# Patient Record
Sex: Female | Born: 2000 | Race: White | Hispanic: No | Marital: Single | Smoking: Never smoker
Health system: Southern US, Community
[De-identification: ages and names within clinical notes are randomized; demographics above are authoritative.]

## PROBLEM LIST (undated history)

## (undated) ENCOUNTER — Inpatient Hospital Stay: Payer: Self-pay

## (undated) DIAGNOSIS — O139 Gestational [pregnancy-induced] hypertension without significant proteinuria, unspecified trimester: Secondary | ICD-10-CM

## (undated) DIAGNOSIS — F909 Attention-deficit hyperactivity disorder, unspecified type: Secondary | ICD-10-CM

## (undated) HISTORY — PX: NO PAST SURGERIES: SHX2092

---
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
## 2021-10-27 ENCOUNTER — Ambulatory Visit: Payer: Medicaid Other

## 2023-01-12 ENCOUNTER — Encounter: Payer: Self-pay | Admitting: *Deleted

## 2023-01-12 ENCOUNTER — Other Ambulatory Visit: Payer: Self-pay

## 2023-01-12 ENCOUNTER — Emergency Department
Admission: EM | Admit: 2023-01-12 | Discharge: 2023-01-12 | Disposition: A | Discharge status: Home / Self Care | Payer: Medicaid Other | Attending: Emergency Medicine | Admitting: Emergency Medicine

## 2023-01-12 DIAGNOSIS — L292 Pruritus vulvae: Secondary | ICD-10-CM | POA: Diagnosis present

## 2023-01-12 DIAGNOSIS — N72 Inflammatory disease of cervix uteri: Secondary | ICD-10-CM | POA: Insufficient documentation

## 2023-01-12 LAB — URINALYSIS, ROUTINE W REFLEX MICROSCOPIC
Bilirubin Urine: NEGATIVE
Glucose, UA: NEGATIVE mg/dL
Ketones, ur: NEGATIVE mg/dL
Nitrite: NEGATIVE
Protein, ur: NEGATIVE mg/dL
RBC / HPF: >50 RBC/hpf (ref 0–5)
Specific Gravity, Urine: 1.006 (ref 1.005–1.030)
pH: 6 (ref 5.0–8.0)

## 2023-01-12 LAB — WET PREP, GENITAL
Clue Cells Wet Prep HPF POC: NONE SEEN
Sperm: NONE SEEN
Trich, Wet Prep: NONE SEEN
WBC, Wet Prep HPF POC: >=10 — AB (ref <10)
Yeast Wet Prep HPF POC: NONE SEEN

## 2023-01-12 MED ORDER — AZITHROMYCIN 500 MG PO TABS
1000.0000 mg | ORAL_TABLET | Freq: Once | ORAL | Status: AC
  Administered 2023-01-12: 1000 mg via ORAL
  Filled 2023-01-12: qty 2

## 2023-01-12 MED ORDER — LIDOCAINE HCL URETHRAL/MUCOSAL 2 % EX GEL
1.0000 | Freq: Once | CUTANEOUS | Status: AC
  Administered 2023-01-12: 1 via TOPICAL
  Filled 2023-01-12: qty 6

## 2023-01-12 MED ORDER — CEFTRIAXONE SODIUM 1 G IJ SOLR
500.0000 mg | Freq: Once | INTRAMUSCULAR | Status: AC
  Administered 2023-01-12: 500 mg via INTRAMUSCULAR
  Filled 2023-01-12: qty 10

## 2023-01-12 MED ORDER — LIDOCAINE 5 % EX OINT: 1.0000 | TOPICAL_OINTMENT | CUTANEOUS | 0 refills | Status: AC | PRN

## 2023-01-12 MED ORDER — FLUCONAZOLE 50 MG PO TABS
150.0000 mg | ORAL_TABLET | Freq: Once | ORAL | Status: AC
  Administered 2023-01-12: 150 mg via ORAL
  Filled 2023-01-12: qty 1

## 2023-01-12 NOTE — Discharge Instructions (Addendum)
No intercourse for the next week.  Follow-up with health department if not improving over the next few days.

## 2023-01-12 NOTE — ED Notes (Signed)
Poct pregnancy Negative 

## 2023-01-12 NOTE — ED Triage Notes (Signed)
Pt reports vaginal irritation and itching   pt using otc meds without relief.  Pt denies urinary sx  no vag bleeding. Pt alert

## 2023-01-12 NOTE — ED Provider Notes (Signed)
[facility] Provider Note    Event Date/Time   First MD Initiated Contact with Patient 01/12/23 2126     (approximate)   History   Vaginal Itching   HPI  Stefanie Owen is a 22 y.o. female  and as listed in EMR presents to the emergency department for evaluation of vaginal itching. No history of yeast infections.  No concern for STI.  She did go to the beach last week and was in a hot tub.  She denies abdominal pain, pelvic pain, or fever..      Physical Exam   Triage Vital Signs: ED Triage Vitals [01/12/23 2046]  Enc Vitals Group     BP 127/85     Pulse Rate (!) 115     Resp 18     Temp 98.5 F (36.9 C)     Temp Source Oral     SpO2 99 %     Weight 200 lb (90.7 kg)     Height  (1.6 m)     Head Circumference      Peak Flow      Pain Score 5     Pain Loc      Pain Edu?      Excl. in GC?     Most recent vital signs: Vitals:   01/12/23 2046  BP: 127/85  Pulse: (!) 115  Resp: 18  Temp: 98.5 F (36.9 C)  SpO2: 99%    General: Awake, no distress.  CV:  Good peripheral perfusion.  Resp:  Normal effort.  Abd:  No distention.  Other:  Vaginal exam deferred per patient request.   ED Results / Procedures / Treatments   Labs (all labs ordered are listed, but only abnormal results are displayed) Labs Reviewed  WET PREP, GENITAL - Abnormal; Notable for the following components:      Result Value   WBC, Wet Prep HPF POC >=10 (*)    All other components within normal limits  URINALYSIS, ROUTINE W REFLEX MICROSCOPIC - Abnormal; Notable for the following components:   Color, Urine YELLOW (*)    APPearance HAZY (*)    Hgb urine dipstick LARGE (*)    Leukocytes,Ua LARGE (*)    Bacteria, UA RARE (*)    All other components within normal limits     EKG  Not indicated   RADIOLOGY  Image and radiology report reviewed and interpreted by me. Radiology report consistent with the same.  Not  indicated  PROCEDURES:  Critical Care performed: No  Procedures   MEDICATIONS ORDERED IN ED:  Medications  cefTRIAXone (ROCEPHIN) injection 500 mg (500 mg Intramuscular Given 01/12/23 2331)  azithromycin (ZITHROMAX) tablet 1,000 mg (1,000 mg Oral Given 01/12/23 2330)  lidocaine (XYLOCAINE) 2 % jelly 1 Application (1 Application Topical Given 01/12/23 2331)  fluconazole (DIFLUCAN) tablet 150 mg (150 mg Oral Given 01/12/23 2330)     IMPRESSION / MDM / ASSESSMENT AND PLAN / ED COURSE   I have reviewed the triage note.  Differential diagnosis includes, but is not limited to, bacterial vaginosis, yeast vaginitis  Patient's presentation is most consistent with acute illness / injury with system symptoms.  22 year old female presenting to the emergency department for treatment and evaluation of vaginal itching and reports a white coating on the vulva.  Patient declines pelvic exam, chlamydia and gonorrhea testing.  Self swab for trichomonas, bacterial vaginosis, and yeast explained to the patient and she obtained this specimen.  Wet prep shows greater  than 10 white blood cells and urine shows large amount leukocytes and rare bacteria.  She has no urinary symptoms.  I suspect STI of a different nature other than trichomonas or bacterial vaginosis.  She will be treated with Rocephin and azithromycin.  It does look like she has some component of candidal vaginal infection and will be given a dose of Diflucan while here.  She also requested some numbing gel for the area and lidocaine was ordered.  Patient instructed to follow-up with the health department for further STI testing if not improving over the next week.       FINAL CLINICAL IMPRESSION(S) / ED DIAGNOSES   Final diagnoses:  Acute cervicitis     Rx / DC Orders   ED Discharge Orders          Ordered    lidocaine (XYLOCAINE) 5 % ointment  As needed        01/12/23 2318             Note:  This document was prepared  using Dragon voice recognition software and may include unintentional dictation errors.   Chinita Pester, FNP 01/12/23 2336    Georga Hacking, MD 01/13/23 [phone]

## 2023-10-02 ENCOUNTER — Emergency Department
Admission: EM | Admit: 2023-10-02 | Discharge: 2023-10-02 | Disposition: A | Discharge status: Home / Self Care | Payer: Medicaid Other | Attending: Emergency Medicine | Admitting: Emergency Medicine

## 2023-10-02 ENCOUNTER — Other Ambulatory Visit: Payer: Self-pay

## 2023-10-02 DIAGNOSIS — E860 Dehydration: Secondary | ICD-10-CM | POA: Insufficient documentation

## 2023-10-02 DIAGNOSIS — R5383 Other fatigue: Secondary | ICD-10-CM | POA: Diagnosis not present

## 2023-10-02 DIAGNOSIS — R55 Syncope and collapse: Secondary | ICD-10-CM | POA: Insufficient documentation

## 2023-10-02 DIAGNOSIS — D649 Anemia, unspecified: Secondary | ICD-10-CM | POA: Diagnosis not present

## 2023-10-02 LAB — CBC
HCT: 37.2 % (ref 36.0–46.0)
Hemoglobin: 11.1 g/dL — ABNORMAL LOW (ref 12.0–15.0)
MCH: 22.7 pg — ABNORMAL LOW (ref 26.0–34.0)
MCHC: 29.8 g/dL — ABNORMAL LOW (ref 30.0–36.0)
MCV: 75.9 fL — ABNORMAL LOW (ref 80.0–100.0)
Platelets: 397 10*3/uL (ref 150–400)
RBC: 4.9 MIL/uL (ref 3.87–5.11)
RDW: 15.2 % (ref 11.5–15.5)
WBC: 8.7 10*3/uL (ref 4.0–10.5)
nRBC: 0 % (ref 0.0–0.2)

## 2023-10-02 LAB — URINALYSIS, ROUTINE W REFLEX MICROSCOPIC
Bilirubin Urine: NEGATIVE
Glucose, UA: NEGATIVE mg/dL
Ketones, ur: NEGATIVE mg/dL
Nitrite: NEGATIVE
Protein, ur: NEGATIVE mg/dL
Specific Gravity, Urine: 1.014 (ref 1.005–1.030)
pH: 5 (ref 5.0–8.0)

## 2023-10-02 LAB — BASIC METABOLIC PANEL
Anion gap: 9 (ref 5–15)
BUN: 14 mg/dL (ref 6–20)
CO2: 23 mmol/L (ref 22–32)
Calcium: 8.5 mg/dL — ABNORMAL LOW (ref 8.9–10.3)
Chloride: 105 mmol/L (ref 98–111)
Creatinine, Ser: 0.83 mg/dL (ref 0.44–1.00)
GFR, Estimated: >60 mL/min (ref >60)
Glucose, Bld: 117 mg/dL — ABNORMAL HIGH (ref 70–99)
Potassium: 3.6 mmol/L (ref 3.5–5.1)
Sodium: 137 mmol/L (ref 135–145)

## 2023-10-02 LAB — POC URINE PREG, ED: Preg Test, Ur: NEGATIVE

## 2023-10-02 MED ORDER — LACTATED RINGERS IV BOLUS
1000.0000 mL | Freq: Once | INTRAVENOUS | Status: AC
  Administered 2023-10-02: 1000 mL via INTRAVENOUS

## 2023-10-02 NOTE — ED Provider Notes (Signed)
 [facility] Provider Note    Event Date/Time   First MD Initiated Contact with Patient 10/02/23 [phone]     (approximate)   History   Near Syncope   HPI Siddhi Dornbush is a 23 y.o. female presenting today for lightheadedness.  Patient states feeling fine this morning when she woke up and she was at [employer] when she started to feel lightheaded.  She thinks she may have passed out but is not sure.  She notes that she has been dehydrated and not drinking a lot of water recently.  Otherwise currently and around the time of event denied chest pain, difficulty breathing, nausea, vomiting, abdominal pain.  No recent dysuria, fevers, congestion, cough.  Denies prior episodes similar to this.  Chart review: Patient had similar syncopal episode in 2022 that was largely found to be related to dehydration.     Physical Exam   Triage Vital Signs: ED Triage Vitals  Encounter Vitals Group     BP 10/02/23 0739 137/89     Systolic BP Percentile --      Diastolic BP Percentile --      Pulse Rate 10/02/23 0739 98     Resp 10/02/23 0739 18     Temp 10/02/23 0739 98 F (36.7 C)     Temp src --      SpO2 10/02/23 0739 100 %     Weight 10/02/23 0736 200 lb (90.7 kg)     Height 10/02/23 0736 5' 3 (1.6 m)     Head Circumference --      Peak Flow --      Pain Score 10/02/23 0736 0     Pain Loc --      Pain Education --      Exclude from Growth Chart --     Most recent vital signs: Vitals:   10/02/23 0739  BP: 137/89  Pulse: 98  Resp: 18  Temp: 98 F (36.7 C)  SpO2: 100%   I have reviewed the vital signs. General:  Awake, alert, no acute distress. Head:  Normocephalic, Atraumatic. EENT:  PERRL, EOMI, Oral mucosa pink and moist, Neck is supple. Cardiovascular: Regular rate, 2+ distal pulses. Respiratory:  Normal respiratory effort, symmetrical expansion, no distress.   Extremities:  Moving all four extremities through full ROM without pain.   Neuro:  Alert  and oriented.  Interacting appropriately.   Skin:  Warm, dry, no rash.   Psych: Appropriate affect.    ED Results / Procedures / Treatments   Labs (all labs ordered are listed, but only abnormal results are displayed) Labs Reviewed  BASIC METABOLIC PANEL - Abnormal; Notable for the following components:      Result Value   Glucose, Bld 117 (*)    Calcium 8.5 (*)    All other components within normal limits  CBC - Abnormal; Notable for the following components:   Hemoglobin 11.1 (*)    MCV 75.9 (*)    MCH 22.7 (*)    MCHC 29.8 (*)    All other components within normal limits  URINALYSIS, ROUTINE W REFLEX MICROSCOPIC - Abnormal; Notable for the following components:   Color, Urine YELLOW (*)    APPearance CLEAR (*)    Hgb urine dipstick SMALL (*)    Leukocytes,Ua SMALL (*)    Bacteria, UA RARE (*)    All other components within normal limits  CBG MONITORING, ED  POC URINE PREG, ED     EKG My EKG  interpretation: Rate of 107, sinus tachycardia, normal axis, normal intervals, no acute ST elevations or depressions   RADIOLOGY    PROCEDURES:  Critical Care performed: No  Procedures   MEDICATIONS ORDERED IN ED: Medications  lactated ringers  bolus 1,000 mL (has no administration in time range)     IMPRESSION / MDM / ASSESSMENT AND PLAN / ED COURSE  I reviewed the triage vital signs and the nursing notes.                              Differential diagnosis includes, but is not limited to, dehydration, near syncope, orthostatic hypotension, lower suspicion for cardiac etiology  Patient's presentation is most consistent with acute complicated illness / injury requiring diagnostic workup.  Patient is a 23 year old female presenting today for lightheadedness and near loss of consciousness.  No associated symptoms around the time of event and currently asymptomatic other than feeling fatigued.  Does specifically note dehydration recently and poor fluid intake.   Slightly tachycardic on arrival but otherwise vital signs stable.  Ambulating without difficulty.  Able to tolerate p.o. without difficulty.  Laboratory workup at this time largely reassuring with normal CBC and BMP with anemia consistent with her baseline if not improved.  UA has rare bacteria and small leukocytes but also squames present.  She has no dysuria symptoms so will not treat as a UTI at this time.  Pregnancy test negative.  EKG without other concerning arrhythmia findings.  Patient was given 1 L of fluids and safe for discharge at this time.  Will give her PCP referral for outpatient follow-up.  Clinical Course as of 10/02/23 9140  [place] Oct 02, 2023  0846 Hemoglobin(!): 11.1 Anemia improved from prior levels [DW]    Clinical Course User Index [DW] Malvina Alm DASEN, MD     FINAL CLINICAL IMPRESSION(S) / ED DIAGNOSES   Final diagnoses:  Near syncope     Rx / DC Orders   ED Discharge Orders          Ordered    Ambulatory Referral to Primary Care (Establish Care)        10/02/23 0859             Note:  This document was prepared using Dragon voice recognition software and may include unintentional dictation errors.   Malvina Alm DASEN, MD 10/02/23 [phone]
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
## 2023-10-02 NOTE — Discharge Instructions (Addendum)
 You can use the resource guide provided to help set up a primary care appointment in the future for yearly monitoring.  Please return for any worsening symptoms.

## 2024-01-07 ENCOUNTER — Other Ambulatory Visit: Payer: Self-pay

## 2024-01-07 ENCOUNTER — Emergency Department
Admission: EM | Admit: 2024-01-07 | Discharge: 2024-01-07 | Disposition: A | Discharge status: Home / Self Care | Attending: Emergency Medicine | Admitting: Emergency Medicine

## 2024-01-07 ENCOUNTER — Emergency Department

## 2024-01-07 DIAGNOSIS — O4690 Antepartum hemorrhage, unspecified, unspecified trimester: Secondary | ICD-10-CM | POA: Diagnosis present

## 2024-01-07 DIAGNOSIS — O036 Delayed or excessive hemorrhage following complete or unspecified spontaneous abortion: Secondary | ICD-10-CM | POA: Insufficient documentation

## 2024-01-07 LAB — CBC WITH DIFFERENTIAL/PLATELET
Abs Immature Granulocytes: 0.03 10*3/uL (ref 0.00–0.07)
Basophils Absolute: 0.1 10*3/uL (ref 0.0–0.1)
Basophils Relative: 1 %
Eosinophils Absolute: 0.1 10*3/uL (ref 0.0–0.5)
Eosinophils Relative: 1 %
HCT: 37.1 % (ref 36.0–46.0)
Hemoglobin: 11.5 g/dL — ABNORMAL LOW (ref 12.0–15.0)
Immature Granulocytes: 0 %
Lymphocytes Relative: 25 %
Lymphs Abs: 2.6 10*3/uL (ref 0.7–4.0)
MCH: 23.4 pg — ABNORMAL LOW (ref 26.0–34.0)
MCHC: 31 g/dL (ref 30.0–36.0)
MCV: 75.6 fL — ABNORMAL LOW (ref 80.0–100.0)
Monocytes Absolute: 0.7 10*3/uL (ref 0.1–1.0)
Monocytes Relative: 7 %
Neutro Abs: 6.7 10*3/uL (ref 1.7–7.7)
Neutrophils Relative %: 66 %
Platelets: 421 10*3/uL — ABNORMAL HIGH (ref 150–400)
RBC: 4.91 MIL/uL (ref 3.87–5.11)
RDW: 15.8 % — ABNORMAL HIGH (ref 11.5–15.5)
WBC: 10.2 10*3/uL (ref 4.0–10.5)
nRBC: 0 % (ref 0.0–0.2)

## 2024-01-07 LAB — COMPREHENSIVE METABOLIC PANEL WITH GFR
ALT: 12 U/L (ref 0–44)
AST: 22 U/L (ref 15–41)
Albumin: 3.7 g/dL (ref 3.5–5.0)
Alkaline Phosphatase: 55 U/L (ref 38–126)
Anion gap: 7 (ref 5–15)
BUN: 12 mg/dL (ref 6–20)
CO2: 23 mmol/L (ref 22–32)
Calcium: 9.1 mg/dL (ref 8.9–10.3)
Chloride: 105 mmol/L (ref 98–111)
Creatinine, Ser: 0.75 mg/dL (ref 0.44–1.00)
GFR, Estimated: >60 mL/min (ref >60)
Glucose, Bld: 95 mg/dL (ref 70–99)
Potassium: 3.4 mmol/L — ABNORMAL LOW (ref 3.5–5.1)
Sodium: 135 mmol/L (ref 135–145)
Total Bilirubin: 1.1 mg/dL (ref 0.0–1.2)
Total Protein: 7.2 g/dL (ref 6.5–8.1)

## 2024-01-07 LAB — TYPE AND SCREEN
ABO/RH(D): A POS
Antibody Screen: NEGATIVE

## 2024-01-07 LAB — HCG, QUANTITATIVE, PREGNANCY: hCG, Beta Chain, Quant, S: 101992 m[IU]/mL — ABNORMAL HIGH (ref <5)

## 2024-01-07 NOTE — Discharge Instructions (Signed)
 You were seen in the ER for your vaginal bleeding.  Please follow-up with OB/GYN for further evaluation.  If you have significant worsening abdominal pain, heavy vaginal bleeding including saturating a pad an under an hour 2 hours in a row, or any other new or concerning symptoms, please return to the ER for further evaluation.

## 2024-01-07 NOTE — ED Triage Notes (Addendum)
 Pt to Ed via [facility] from [facility] for vaginal bleeding and cramping. Pt took abortion pills from the [facility] in [place] about 2000 yesterday. Pt is passing clots and having abdominal cramping and is concerned about the amount of clots. Pt stated she is [redacted] weeks pregnant. G3P1  Vital signs were all within normal limits for EMS and pt is ambulatory.

## 2024-01-07 NOTE — ED Provider Notes (Signed)
 [facility] Provider Note    Event Date/Time   First MD Initiated Contact with Patient 01/07/24 0001     (approximate)   History   Vaginal Bleeding   HPI  Stefanie Owen is a 23 year old female G3, P1 presenting with vaginal bleeding.  [facility] in [place].  Reports she was given a pill, unsure the name of those and discharged with 4 pills of misoprostol to take today.  She used these around 8 PM and shortly after had onset of abdominal cramping with moderate to large amounts of vaginal bleeding with passage of clots.  Did also have some lightheadedness leading her to present to the ER.  No syncope.     Physical Exam   Triage Vital Signs: ED Triage Vitals  Encounter Vitals Group     BP 01/07/24 0010 120/62     Systolic BP Percentile --      Diastolic BP Percentile --      Pulse Rate 01/07/24 0010 89     Resp 01/07/24 0010 18     Temp 01/07/24 0010 98.2 F (36.8 C)     Temp Source 01/07/24 0010 Oral     SpO2 01/07/24 0010 100 %     Weight 01/07/24 0013 220 lb (99.8 kg)     Height 01/07/24 0013 5\' 4"  (1.626 m)     Head Circumference --      Peak Flow --      Pain Score 01/07/24 0011 8     Pain Loc --      Pain Education --      Exclude from Growth Chart --     Most recent vital signs: Vitals:   01/07/24 0010  BP: 120/62  Pulse: 89  Resp: 18  Temp: 98.2 F (36.8 C)  SpO2: 100%     General: Awake, interactive  CV:  Regular rate, good peripheral perfusion.  Resp:  Unlabored respirations, lungs good auscultation Abd:  Nondistended, soft, no significant tenderness to palpation GU:  Moderate amount of blood noted within the vaginal vault with small clots, able to be cleared, cervix visualized without visible protruding material Neuro:  Symmetric facial movement, fluid speech   ED Results / Procedures / Treatments   Labs (all labs ordered are listed, but only abnormal results are  displayed) Labs Reviewed  CBC WITH DIFFERENTIAL/PLATELET - Abnormal; Notable for the following components:      Result Value   Hemoglobin 11.5 (*)    MCV 75.6 (*)    MCH 23.4 (*)    RDW 15.8 (*)    Platelets 421 (*)    All other components within normal limits  COMPREHENSIVE METABOLIC PANEL WITH GFR - Abnormal; Notable for the following components:   Potassium 3.4 (*)    All other components within normal limits  HCG, QUANTITATIVE, PREGNANCY - Abnormal; Notable for the following components:   hCG, Beta Chain, Quant, S 101,992 (*)    All other components within normal limits  TYPE AND SCREEN     EKG EKG independently reviewed interpreted by myself (ER attending) demonstrates:    RADIOLOGY Imaging independently reviewed and interpreted by myself demonstrates:   Ultrasound demonstrates likely retained products  US  OB Comp < 14 Wks Result Date: 01/07/2024 CLINICAL DATA:  ongoing abortion, eval retained products. Abortion pill take kidney yesterday now passing clots. EXAM: OBSTETRIC <14 WK ULTRASOUND TECHNIQUE: Transabdominal ultrasound was performed for evaluation of the gestation as well as the  maternal uterus and adnexal regions. COMPARISON:  None Available. FINDINGS: Intrauterine gestational sac: None Yolk sac:  Not Visualized. Embryo:  Not Visualized. Cardiac Activity: Not Visualized. Maternal uterus/adnexae: Bilateral ovaries unremarkable. Thickened vascular heterogeneous endometrium-exact measurements difficult on transabdominal only ultrasound. IMPRESSION: Thickened heterogeneous and vascular endometrium with limited evaluation on this transabdominal only ultrasound. Findings suggestive of retained products of conception in the setting of an ongoing abortion. Correlate with beta HCG. Recommend trending of beta hCG to 0. Electronically Signed   By: Morgane  Naveau M.D.   On: 01/07/2024 01:26    PROCEDURES:  Critical Care performed: No  Procedures   MEDICATIONS ORDERED IN  ED: Medications - No data to display   IMPRESSION / MDM / ASSESSMENT AND PLAN / ED COURSE  I reviewed the triage vital signs and the nursing notes.  Differential diagnosis includes, but is not limited to, vaginal bleeding in the setting of ongoing abortion, consideration for acute blood loss anemia, electrolyte abnormality  Patient's presentation is most consistent with acute presentation with potential threat to life or bodily function.  23 year old female presenting for evaluation of vaginal bleeding.  Stable vitals on presentation.  Labs with stable anemia from prior.  hCG remains significantly elevated but of 100,000.  Ultrasound with likely retained products.  On exam here, patient with moderate blood in her vaginal vault, but no significant active bleeding.  Patient would prefer to avoid D&C if possible.  Case reviewed with CNM free with OB/GYN.  Based on reassuring workup here, does think patient is stable for discharge with outpatient follow-up.  Will give information for follow-up with [facility].  Strict return precautions provided.  Patient discharged stable condition.      FINAL CLINICAL IMPRESSION(S) / ED DIAGNOSES   Final diagnoses:  Abortion with bleeding     Rx / DC Orders   ED Discharge Orders     None        Note:  This document was prepared using Dragon voice recognition software and may include unintentional dictation errors.   Claria Crofts, MD 01/07/24 [phone]

## 2024-03-24 ENCOUNTER — Other Ambulatory Visit: Payer: Self-pay

## 2024-03-24 ENCOUNTER — Emergency Department
Admission: EM | Admit: 2024-03-24 | Discharge: 2024-03-24 | Discharge status: Against Medical Advice | Attending: Emergency Medicine | Admitting: Emergency Medicine

## 2024-03-24 DIAGNOSIS — S0185XA Open bite of other part of head, initial encounter: Secondary | ICD-10-CM | POA: Diagnosis present

## 2024-03-24 DIAGNOSIS — Z5321 Procedure and treatment not carried out due to patient leaving prior to being seen by health care provider: Secondary | ICD-10-CM | POA: Insufficient documentation

## 2024-03-24 DIAGNOSIS — S21151A Open bite of right front wall of thorax without penetration into thoracic cavity, initial encounter: Secondary | ICD-10-CM | POA: Insufficient documentation
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
